# Patient Record
Sex: Female | Born: 1970 | Race: White | Hispanic: No | Smoking: Never smoker
Health system: Southern US, Community
[De-identification: ages and names within clinical notes are randomized; demographics above are authoritative.]

## PROBLEM LIST (undated history)

## (undated) DIAGNOSIS — M549 Dorsalgia, unspecified: Secondary | ICD-10-CM

## (undated) DIAGNOSIS — F988 Other specified behavioral and emotional disorders with onset usually occurring in childhood and adolescence: Secondary | ICD-10-CM

## (undated) DIAGNOSIS — O24410 Gestational diabetes mellitus in pregnancy, diet controlled: Secondary | ICD-10-CM

## (undated) DIAGNOSIS — R51 Headache: Secondary | ICD-10-CM

## (undated) DIAGNOSIS — E669 Obesity, unspecified: Secondary | ICD-10-CM

## (undated) HISTORY — PX: TUBAL LIGATION: SHX77

## (undated) HISTORY — DX: Other specified behavioral and emotional disorders with onset usually occurring in childhood and adolescence: F98.8

## (undated) HISTORY — DX: Obesity, unspecified: E66.9

## (undated) HISTORY — PX: TONSILLECTOMY: SUR1361

## (undated) HISTORY — PX: WISDOM TOOTH EXTRACTION: SHX21

## (undated) HISTORY — DX: Dorsalgia, unspecified: M54.9

## (undated) HISTORY — DX: Gestational diabetes mellitus in pregnancy, diet controlled: O24.410

## (undated) HISTORY — PX: CHOLECYSTECTOMY: SHX55

---

## 2011-11-01 ENCOUNTER — Other Ambulatory Visit (HOSPITAL_COMMUNITY)
Admission: RE | Admit: 2011-11-01 | Discharge: 2011-11-01 | Disposition: A | Discharge status: Home / Self Care | Payer: 59 | Source: Ambulatory Visit | Attending: Family Medicine | Admitting: Family Medicine

## 2011-11-01 DIAGNOSIS — Z1159 Encounter for screening for other viral diseases: Secondary | ICD-10-CM | POA: Insufficient documentation

## 2011-11-01 DIAGNOSIS — Z124 Encounter for screening for malignant neoplasm of cervix: Secondary | ICD-10-CM | POA: Insufficient documentation

## 2012-01-23 ENCOUNTER — Encounter (HOSPITAL_COMMUNITY): Payer: Self-pay | Admitting: *Deleted

## 2012-01-29 ENCOUNTER — Encounter (HOSPITAL_COMMUNITY): Payer: Self-pay | Admitting: Pharmacist

## 2012-01-31 ENCOUNTER — Other Ambulatory Visit: Payer: Self-pay | Admitting: Obstetrics and Gynecology

## 2012-02-06 MED ORDER — CLINDAMYCIN PHOSPHATE 600 MG/50ML IV SOLN
600.0000 mg | Freq: Once | INTRAVENOUS | Status: AC
  Administered 2012-02-07: 600 mg via INTRAVENOUS
  Filled 2012-02-06: qty 50

## 2012-02-06 NOTE — H&P (Signed)
Andrea Padilla, Andrea Padilla                  ACCOUNT NO.:  [id number]  MEDICAL RECORD NO.:  [id number]  LOCATION:  [facility]                         FACILITY:  [facility]  PHYSICIAN:  Lenoard Aden, M.D.DATE OF BIRTH:  1971/06/10  DATE OF ADMISSION:  01/22/2012 DATE OF DISCHARGE:                             HISTORY & PHYSICAL   CHIEF COMPLAINT:  Refractory menorrhagia.  HISTORY OF PRESENT ILLNESS:  She is a 41 year old white female, G4, P2 with dysfunctional uterine bleeding and questionable structural lesion on ultrasound.  ALLERGIES:  She has no allergies to penicillin, iodine, aspirin, codeine, cephalosporin and Levaquin.  MEDICATIONS:  Include birth control pills.  FAMILY HISTORY:  Breast cancer, hypertension, history of 2 vaginal deliveries, 2 miscarriages.  SURGICAL HISTORY:  Remarkable for cholecystectomy, tonsillectomy.  PHYSICAL EXAM:  She is a well-developed, well-nourished, white female, in no acute distress.  Height 64 inches, weight 203 pounds.  HEENT is normal.  Neck supple.  Full range of motion.  Lungs are clear.  Heart, regular rate and rhythm.  Abdomen, soft, nontender.  Pelvic exam reveals the uterus to be small, normal size and position, retroflexed.  No adnexal masses.  IMPRESSION:  Refractory menorrhagia for definitive therapy.  PLAN:  Diagnostic hysteroscopy, VersaPoint resection, NovaSure endometrial ablation.  Risks of anesthesia, infection, bleeding, injury to abdominal organs, need for repair was discussed, delayed versus immediate complications to include bowel and bladder injury noted.  The patient acknowledges and wishes to proceed.     Lenoard Aden, M.D.     RJT/MEDQ  D:  02/06/2012  T:  02/06/2012  Job:  [id number]

## 2012-02-07 ENCOUNTER — Encounter (HOSPITAL_COMMUNITY): Payer: Self-pay | Admitting: Registered Nurse

## 2012-02-07 ENCOUNTER — Encounter (HOSPITAL_COMMUNITY): Payer: Self-pay | Admitting: *Deleted

## 2012-02-07 ENCOUNTER — Ambulatory Visit (HOSPITAL_COMMUNITY)
Admission: RE | Admit: 2012-02-07 | Discharge: 2012-02-07 | Disposition: A | Discharge status: Home / Self Care | Payer: 59 | Source: Ambulatory Visit | Attending: Obstetrics and Gynecology | Admitting: Obstetrics and Gynecology

## 2012-02-07 ENCOUNTER — Other Ambulatory Visit: Payer: Self-pay | Admitting: Obstetrics and Gynecology

## 2012-02-07 ENCOUNTER — Encounter (HOSPITAL_COMMUNITY)
Admission: RE | Disposition: A | Discharge status: Home / Self Care | Payer: Self-pay | Source: Ambulatory Visit | Attending: Obstetrics and Gynecology

## 2012-02-07 ENCOUNTER — Ambulatory Visit (HOSPITAL_COMMUNITY): Payer: 59 | Admitting: Registered Nurse

## 2012-02-07 DIAGNOSIS — N840 Polyp of corpus uteri: Secondary | ICD-10-CM | POA: Insufficient documentation

## 2012-02-07 DIAGNOSIS — N920 Excessive and frequent menstruation with regular cycle: Secondary | ICD-10-CM

## 2012-02-07 HISTORY — DX: Headache: R51

## 2012-02-07 HISTORY — PX: NOVASURE ABLATION: SHX5394

## 2012-02-07 LAB — CBC
HCT: 39.4 % (ref 36.0–46.0)
RDW: 12.8 % (ref 11.5–15.5)
WBC: 8.4 10*3/uL (ref 4.0–10.5)

## 2012-02-07 SURGERY — DILATATION & CURETTAGE/HYSTEROSCOPY WITH VERSAPOINT RESECTION
Anesthesia: General | Site: Vagina | Wound class: Clean Contaminated

## 2012-02-07 MED ORDER — MEPERIDINE HCL 25 MG/ML IJ SOLN: 6.2500 mg | INTRAMUSCULAR | Status: DC | PRN

## 2012-02-07 MED ORDER — KETOROLAC TROMETHAMINE 30 MG/ML IJ SOLN
INTRAMUSCULAR | Status: AC
  Filled 2012-02-07: qty 1

## 2012-02-07 MED ORDER — BUPIVACAINE HCL (PF) 0.25 % IJ SOLN
INTRAMUSCULAR | Status: DC | PRN
  Administered 2012-02-07: 20 mL

## 2012-02-07 MED ORDER — OXYCODONE-ACETAMINOPHEN 5-325 MG PO TABS: 1.0000 | ORAL_TABLET | ORAL | Status: AC | PRN

## 2012-02-07 MED ORDER — PROMETHAZINE HCL 25 MG RE SUPP
25.0000 mg | RECTAL | Status: DC | PRN
  Administered 2012-02-07: 25 mg via RECTAL

## 2012-02-07 MED ORDER — FENTANYL CITRATE 0.05 MG/ML IJ SOLN
INTRAMUSCULAR | Status: DC | PRN
  Administered 2012-02-07: 100 ug via INTRAVENOUS

## 2012-02-07 MED ORDER — FENTANYL CITRATE 0.05 MG/ML IJ SOLN
INTRAMUSCULAR | Status: AC
  Filled 2012-02-07: qty 2

## 2012-02-07 MED ORDER — LIDOCAINE HCL (CARDIAC) 20 MG/ML IV SOLN
INTRAVENOUS | Status: AC
  Filled 2012-02-07: qty 5

## 2012-02-07 MED ORDER — FENTANYL CITRATE 0.05 MG/ML IJ SOLN
25.0000 ug | INTRAMUSCULAR | Status: DC | PRN
  Administered 2012-02-07: 50 ug via INTRAVENOUS
  Administered 2012-02-07: 25 ug via INTRAVENOUS
  Administered 2012-02-07: 50 ug via INTRAVENOUS

## 2012-02-07 MED ORDER — METOCLOPRAMIDE HCL 5 MG/ML IJ SOLN: 10.0000 mg | Freq: Once | INTRAMUSCULAR | Status: DC | PRN

## 2012-02-07 MED ORDER — FENTANYL CITRATE 0.05 MG/ML IJ SOLN
INTRAMUSCULAR | Status: AC
Start: 2012-02-07 — End: 2012-02-07
  Filled 2012-02-07: qty 2

## 2012-02-07 MED ORDER — DEXAMETHASONE SODIUM PHOSPHATE 10 MG/ML IJ SOLN
INTRAMUSCULAR | Status: AC
  Filled 2012-02-07: qty 1

## 2012-02-07 MED ORDER — PROMETHAZINE HCL 25 MG RE SUPP
RECTAL | Status: AC
  Filled 2012-02-07: qty 1

## 2012-02-07 MED ORDER — PROPOFOL 10 MG/ML IV EMUL
INTRAVENOUS | Status: AC
  Filled 2012-02-07: qty 20

## 2012-02-07 MED ORDER — SODIUM CHLORIDE 0.9 % IR SOLN
Status: DC | PRN
  Administered 2012-02-07: 3000 mL

## 2012-02-07 MED ORDER — ONDANSETRON HCL 4 MG/2ML IJ SOLN
INTRAMUSCULAR | Status: DC | PRN
  Administered 2012-02-07: 4 mg via INTRAVENOUS

## 2012-02-07 MED ORDER — VASOPRESSIN 20 UNIT/ML IJ SOLN
INTRAMUSCULAR | Status: AC
  Filled 2012-02-07: qty 1

## 2012-02-07 MED ORDER — MIDAZOLAM HCL 5 MG/5ML IJ SOLN
INTRAMUSCULAR | Status: DC | PRN
  Administered 2012-02-07: 2 mg via INTRAVENOUS

## 2012-02-07 MED ORDER — PROPOFOL 10 MG/ML IV EMUL
INTRAVENOUS | Status: DC | PRN
  Administered 2012-02-07: 200 mg via INTRAVENOUS

## 2012-02-07 MED ORDER — LIDOCAINE HCL (CARDIAC) 20 MG/ML IV SOLN
INTRAVENOUS | Status: DC | PRN
  Administered 2012-02-07: 80 mg via INTRAVENOUS

## 2012-02-07 MED ORDER — DEXAMETHASONE SODIUM PHOSPHATE 10 MG/ML IJ SOLN
INTRAMUSCULAR | Status: DC | PRN
  Administered 2012-02-07: 10 mg via INTRAVENOUS

## 2012-02-07 MED ORDER — LACTATED RINGERS IV SOLN
INTRAVENOUS | Status: DC
  Administered 2012-02-07 (×2): via INTRAVENOUS

## 2012-02-07 MED ORDER — KETOROLAC TROMETHAMINE 30 MG/ML IJ SOLN
INTRAMUSCULAR | Status: DC | PRN
  Administered 2012-02-07: 30 mg via INTRAVENOUS

## 2012-02-07 MED ORDER — ONDANSETRON HCL 4 MG/2ML IJ SOLN
INTRAMUSCULAR | Status: AC
  Filled 2012-02-07: qty 2

## 2012-02-07 MED ORDER — MIDAZOLAM HCL 2 MG/2ML IJ SOLN
INTRAMUSCULAR | Status: AC
  Filled 2012-02-07: qty 2

## 2012-02-07 SURGICAL SUPPLY — 13 items
ABLATOR ENDOMETRIAL BIPOLAR (ABLATOR) ×1 IMPLANT
CANISTER SUCTION 2500CC (MISCELLANEOUS) ×2 IMPLANT
CATH ROBINSON RED A/P 16FR (CATHETERS) ×2 IMPLANT
CLOTH BEACON ORANGE TIMEOUT ST (SAFETY) ×2 IMPLANT
CONTAINER PREFILL 10% NBF 60ML (FORM) ×4 IMPLANT
ELECTRODE RT ANGLE VERSAPOINT (CUTTING LOOP) ×2 IMPLANT
GLOVE BIO SURGEON STRL SZ7.5 (GLOVE) ×4 IMPLANT
GOWN PREVENTION PLUS LG XLONG (DISPOSABLE) ×2 IMPLANT
GOWN STRL REIN XL XLG (GOWN DISPOSABLE) ×2 IMPLANT
PACK HYSTEROSCOPY LF (CUSTOM PROCEDURE TRAY) ×2 IMPLANT
SYR TB 1ML 25GX5/8 (SYRINGE) ×2 IMPLANT
TOWEL OR 17X24 6PK STRL BLUE (TOWEL DISPOSABLE) ×4 IMPLANT
WATER STERILE IRR 1000ML POUR (IV SOLUTION) ×2 IMPLANT

## 2012-02-07 NOTE — Transfer of Care (Signed)
Immediate Anesthesia Transfer of Care Note  Patient: Andrea Padilla  Procedure(s) Performed: Procedure(s) (LRB): DILATATION & CURETTAGE/HYSTEROSCOPY WITH VERSAPOINT RESECTION (N/A) NOVASURE ABLATION (N/A)  Patient Location: PACU  Anesthesia Type: General  Level of Consciousness: awake  Airway & Oxygen Therapy: Patient Spontanous Breathing  Post-op Assessment: Post -op Vital signs reviewed and stable and Patient moving all extremities  Post vital signs: Reviewed and stable  Complications: No apparent anesthesia complications

## 2012-02-07 NOTE — Anesthesia Postprocedure Evaluation (Signed)
  Anesthesia Post-op Note  Patient: Andrea Padilla  Procedure(s) Performed: Procedure(s) (LRB): DILATATION & CURETTAGE/HYSTEROSCOPY WITH VERSAPOINT RESECTION (N/A) NOVASURE ABLATION (N/A)  Patient Location: PACU  Anesthesia Type: General  Level of Consciousness: awake, alert  and oriented  Airway and Oxygen Therapy: Patient Spontanous Breathing  Post-op Pain: none  Post-op Assessment: Post-op Vital signs reviewed, Patient's Cardiovascular Status Stable, Respiratory Function Stable, Patent Airway, No signs of Nausea or vomiting and Pain level controlled  Post-op Vital Signs: Reviewed and stable  Complications: No apparent anesthesia complications

## 2012-02-07 NOTE — Discharge Instructions (Addendum)
Hysteroscopy Hysteroscopy is a procedure used for looking inside the womb (uterus). It may be done for many different reasons, including:  To evaluate abnormal bleeding, fibroid (benign, noncancerous) tumors, polyps, scar tissue (adhesions), and possibly cancer of the uterus.   To look for lumps (tumors) and other uterine growths.   To look for causes of why a woman cannot get pregnant (infertility), causes of recurrent loss of pregnancy (miscarriages), or a lost intrauterine device (IUD).   To perform a sterilization by blocking the fallopian tubes from inside the uterus.  A hysteroscopy should be done right after a menstrual period to be sure you are not pregnant. LET YOUR CAREGIVER KNOW ABOUT:   Allergies.   Medicines taken, including herbs, eyedrops, over-the-counter medicines, and creams.   Use of steroids (by mouth or creams).   Previous problems with anesthetics or numbing medicines.   History of bleeding or blood problems.   History of blood clots.   Possibility of pregnancy, if this applies.   Previous surgery.   Other health problems.  RISKS AND COMPLICATIONS   Putting a hole in the uterus.   Excessive bleeding.   Infection.   Damage to the cervix.   Injury to other organs.   Allergic reaction to medicines.   Too much fluid used in the uterus for the procedure.  BEFORE THE PROCEDURE   Do not take aspirin or blood thinners for a week before the procedure, or as directed. It can cause bleeding.   Arrive at least 60 minutes before the procedure or as directed to read and sign the necessary forms.   Arrange for someone to take you home after the procedure.   If you smoke, do not smoke for 2 weeks before the procedure.  PROCEDURE   Your caregiver may give you medicine to relax you. He or she may also give you a medicine that numbs the area around the cervix (local anesthetic) or a medicine that makes you sleep (general anesthesia).   Sometimes, a  medicine is placed in the cervix the day before the procedure. This medicine makes the cervix have a larger opening (dilate). This makes it easier for the instrument to be inserted into the uterus.   A small instrument (hysteroscope) is inserted through the vagina into the uterus. This instrument is similar to a pencil-sized telescope with a light.   During the procedure, air or a liquid is put into the uterus, which allows the surgeon to see better.   Sometimes, tissue is gently scraped from inside the uterus. These tissue samples are sent to a specialist who looks at tissue samples (pathologist). The pathologist will give a report to your caregiver. This will help your caregiver decide if further treatment is necessary. The report will also help your caregiver decide on the best treatment if the test comes back abnormal.  AFTER THE PROCEDURE   If you had a general anesthetic, you may be groggy for a couple hours after the procedure.   If you had a local anesthetic, you will be advised to rest at the surgical center or caregiver's office until you are stable and feel ready to go home.   You may have some cramping for a couple days.   You may have bleeding, which varies from light spotting for a few days to menstrual-like bleeding for up to 3 to 7 days. This is normal.   Have someone take you home.  FINDING OUT THE RESULTS OF YOUR TEST Not all test results   are available during your visit. If your test results are not back during the visit, make an appointment with your caregiver to find out the results. Do not assume everything is normal if you have not heard from your caregiver or the medical facility. It is important for you to follow up on all of your test results. HOME CARE INSTRUCTIONS   Do not drive for 24 hours or as instructed.   Only take over-the-counter or prescription medicines for pain, discomfort, or fever as directed by your caregiver.   Do not take aspirin. It can cause or  aggravate bleeding.   Do not drive or drink alcohol while taking pain medicine.   You may resume your usual diet.   Do not use tampons, douche, or have sexual intercourse for 2 weeks, or as advised by your caregiver.   Rest and sleep for the first 24 to 48 hours.   Take your temperature twice a day for 4 to 5 days. Write it down. Give these temperatures to your caregiver if they are abnormal (above 98.6 F or 37.0 C).   Take medicines your caregiver has ordered as directed.   Follow your caregiver's advice regarding diet, exercise, lifting, driving, and general activities.   Take showers instead of baths for 2 weeks, or as recommended by your caregiver.   If you develop constipation:   Take a mild laxative with the advice of your caregiver.   Eat bran foods.   Drink enough water and fluids to keep your urine clear or pale yellow.   Try to have someone with you or available to you for the first 24 to 48 hours, especially if you had a general anesthetic.   Make sure you and your family understand everything about your operation and recovery.   Follow your caregiver's advice regarding follow-up appointments and Pap smears.  SEEK MEDICAL CARE IF:   You feel dizzy or lightheaded.   You feel sick to your stomach (nauseous).   You develop abnormal vaginal discharge.   You develop a rash.   You have an abnormal reaction or allergy to your medicine.   You need stronger pain medicine.  SEEK IMMEDIATE MEDICAL CARE IF:   Bleeding is heavier than a normal menstrual period or you have blood clots.   You have an oral temperature above 102 F (38.9 C), not controlled by medicine.   You have increasing cramps or pains not relieved with medicine.   You develop belly (abdominal) pain that does not seem to be related to the same area of earlier cramping and pain.   You pass out.   You develop pain in the tops of your shoulders (shoulder strap areas).   You develop shortness of  breath.  MAKE SURE YOU:   Understand these instructions.   Will watch your condition.   Will get help right away if you are not doing well or get worse.  Document Released: 03/17/2001 Document Revised: 08/21/2011 Document Reviewed: 07/10/2009 [employer] Patient Information 2012 [employer]. 

## 2012-02-07 NOTE — Op Note (Signed)
02/07/2012  2:37 PM  PATIENT:  Andrea Padilla  41 y.o. female  PRE-OPERATIVE DIAGNOSIS:  Menorrhagia Endometrial mass  POST-OPERATIVE DIAGNOSIS: Large endometrial polyp  PROCEDURE:  Procedure(s): DILATATION & CURETTAGE/HYSTEROSCOPY WITH VERSAPOINT RESECTION  SURGEON:  Surgeon(s): Lenoard Aden, MD  ASSISTANTS: none   ANESTHESIA:   local and general  ESTIMATED BLOOD LOSS: * No blood loss amount entered *   DRAINS: none   LOCAL MEDICATIONS USED:  MARCAINE   20cc  SPECIMEN:  Source of Specimen:  EMC and large polyp fragments  DISPOSITION OF SPECIMEN:  PATHOLOGY  COUNTS:  YES  DICTATION #: [id number]  PLAN OF CARE: DC home  PATIENT DISPOSITION:  PACU - hemodynamically stable.

## 2012-02-07 NOTE — Progress Notes (Signed)
Patient ID: Andrea Padilla, female   DOB: 10-24-1971, 41 y.o.   MRN: [id number] Patient seen and examined. Consent witnessed and signed. No changes noted. Update completed.

## 2012-02-07 NOTE — Anesthesia Preprocedure Evaluation (Signed)
Anesthesia Evaluation  Patient identified by MRN, date of birth, ID band Patient awake    Reviewed: Allergy & Precautions, H&P , NPO status , Patient's Chart, lab work & pertinent test results  Airway Mallampati: II TM Distance: >3 FB Neck ROM: Full    Dental No notable dental hx. (+) Teeth Intact   Pulmonary neg pulmonary ROS,  clear to auscultation  Pulmonary exam normal       Cardiovascular Regular Normal    Neuro/Psych  Headaches, Negative Psych ROS   GI/Hepatic negative GI ROS, Neg liver ROS,   Endo/Other  Negative Endocrine ROS  Renal/GU negative Renal ROS  Genitourinary negative   Musculoskeletal negative musculoskeletal ROS (+)   Abdominal   Peds  Hematology negative hematology ROS (+)   Anesthesia Other Findings   Reproductive/Obstetrics negative OB ROS                           Anesthesia Physical Anesthesia Plan  ASA: II  Anesthesia Plan: General   Post-op Pain Management:    Induction: Intravenous  Airway Management Planned: LMA  Additional Equipment:   Intra-op Plan:   Post-operative Plan: Extubation in OR  Informed Consent: I have reviewed the patients History and Physical, chart, labs and discussed the procedure including the risks, benefits and alternatives for the proposed anesthesia with the patient or authorized representative who has indicated his/her understanding and acceptance.   Dental advisory given  Plan Discussed with: CRNA, Anesthesiologist and Surgeon  Anesthesia Plan Comments:         Anesthesia Quick Evaluation

## 2012-02-08 NOTE — Op Note (Signed)
NAME:  Andrea Padilla, MOLNAR                  ACCOUNT NO.:  [id number]  MEDICAL RECORD NO.:  [id number]  LOCATION:  [facility]                          FACILITY:  [facility]  PHYSICIAN:  Lenoard Aden, M.D.DATE OF BIRTH:  Sep 01, 1971  DATE OF PROCEDURE: DATE OF DISCHARGE:  02/07/2012                              OPERATIVE REPORT   DESCRIPTION OF PROCEDURE:  After being apprised of risks of anesthesia, infection, bleeding, injury to abdominal organs, need for repair, delayed versus immediate complications to include bowel and bladder injury, possible need for repair, the patient brought to the operating room, was administered general anesthetic without complications, prepped and draped in usual sterile fashion.  Catheterized until bladder was empty.  Exam under anesthesia revealed a bulky retroflexed uterus and no adnexal masses.  At this time, paracervical block placed with dilute Marcaine solution.  Single-toothed tenaculum placed.  Cervix easily dilated up to a #33 Pratt dilator.  However, it was noted that the single-tooth tenaculum created a small laceration on the cervix and replaced with Christella Hartigan tenaculum.  Visualization reveals a large posterior wall fundal endometrial polyp, which was resected using the VersaPoint in multiple passes resecting the broad-based polyp in its entirety. There was a small polyp along the left lateral wall, which was also resected, D and C was then performed in a 4-quadrant method. Endometrial curettings were sent with the polypoid fragments to pathology.  At this time, the NovaSure device was seated in the appropriate fashion to a length of 6.5 and a width of 4.6.  The device was then initiated.  CO2 test was negative.  The procedure was then performed in a standard fashion.  The device was then removed after the end of the cycle, reinspected, found to be intact.  Visualization reveals an intact endometrial cavity.  No evidence of uterine perforation.  Fluid deficit  minimal.  The patient tolerated procedure well, was awakened and transferred to recovery in good condition after repair of a small cervical laceration with 2-0 Vicryl in a continuous running fashion.     Lenoard Aden, M.D.     RJT/MEDQ  D:  02/07/2012  T:  02/08/2012  Job:  [id number]

## 2012-02-10 ENCOUNTER — Encounter (HOSPITAL_COMMUNITY): Payer: Self-pay | Admitting: Obstetrics and Gynecology

## 2013-05-12 ENCOUNTER — Emergency Department (HOSPITAL_COMMUNITY)
Admission: EM | Admit: 2013-05-12 | Discharge: 2013-05-12 | Discharge status: Absent without leave | Payer: Self-pay | Source: Home / Self Care

## 2014-02-24 ENCOUNTER — Encounter (HOSPITAL_COMMUNITY): Payer: Self-pay | Admitting: Emergency Medicine

## 2014-02-24 ENCOUNTER — Emergency Department (HOSPITAL_COMMUNITY)
Admission: EM | Admit: 2014-02-24 | Discharge: 2014-02-24 | Disposition: A | Discharge status: Home / Self Care | Payer: 59 | Attending: Emergency Medicine | Admitting: Emergency Medicine

## 2014-02-24 ENCOUNTER — Emergency Department (HOSPITAL_COMMUNITY): Payer: 59

## 2014-02-24 DIAGNOSIS — Z880 Allergy status to penicillin: Secondary | ICD-10-CM | POA: Insufficient documentation

## 2014-02-24 DIAGNOSIS — R000 Tachycardia, unspecified: Secondary | ICD-10-CM | POA: Insufficient documentation

## 2014-02-24 DIAGNOSIS — Z79899 Other long term (current) drug therapy: Secondary | ICD-10-CM | POA: Insufficient documentation

## 2014-02-24 DIAGNOSIS — Z9089 Acquired absence of other organs: Secondary | ICD-10-CM | POA: Insufficient documentation

## 2014-02-24 DIAGNOSIS — J111 Influenza due to unidentified influenza virus with other respiratory manifestations: Secondary | ICD-10-CM

## 2014-02-24 DIAGNOSIS — H53149 Visual discomfort, unspecified: Secondary | ICD-10-CM | POA: Insufficient documentation

## 2014-02-24 DIAGNOSIS — M436 Torticollis: Secondary | ICD-10-CM | POA: Insufficient documentation

## 2014-02-24 DIAGNOSIS — R519 Headache, unspecified: Secondary | ICD-10-CM

## 2014-02-24 DIAGNOSIS — R110 Nausea: Secondary | ICD-10-CM | POA: Insufficient documentation

## 2014-02-24 DIAGNOSIS — R51 Headache: Secondary | ICD-10-CM

## 2014-02-24 DIAGNOSIS — Z8679 Personal history of other diseases of the circulatory system: Secondary | ICD-10-CM | POA: Insufficient documentation

## 2014-02-24 LAB — URINALYSIS, ROUTINE W REFLEX MICROSCOPIC
BILIRUBIN URINE: NEGATIVE
GLUCOSE, UA: NEGATIVE mg/dL
Hgb urine dipstick: NEGATIVE
KETONES UR: NEGATIVE mg/dL
LEUKOCYTES UA: NEGATIVE
Nitrite: NEGATIVE
PROTEIN: NEGATIVE mg/dL
Specific Gravity, Urine: 1.006 (ref 1.005–1.030)
Urobilinogen, UA: 0.2 mg/dL (ref 0.0–1.0)
pH: 6.5 (ref 5.0–8.0)

## 2014-02-24 LAB — GRAM STAIN: Special Requests: NORMAL

## 2014-02-24 LAB — COMPREHENSIVE METABOLIC PANEL
ALBUMIN: 3.9 g/dL (ref 3.5–5.2)
ALK PHOS: 93 U/L (ref 39–117)
ALT: 30 U/L (ref 0–35)
AST: 30 U/L (ref 0–37)
BILIRUBIN TOTAL: 0.6 mg/dL (ref 0.3–1.2)
BUN: 7 mg/dL (ref 6–23)
CHLORIDE: 100 meq/L (ref 96–112)
CO2: 24 mEq/L (ref 19–32)
Calcium: 9.3 mg/dL (ref 8.4–10.5)
Creatinine, Ser: 0.72 mg/dL (ref 0.50–1.10)
GFR calc Af Amer: >90 mL/min (ref >90)
GFR calc non Af Amer: >90 mL/min (ref >90)
GLUCOSE: 110 mg/dL — AB (ref 70–99)
POTASSIUM: 4.2 meq/L (ref 3.7–5.3)
SODIUM: 138 meq/L (ref 137–147)
Total Protein: 7.5 g/dL (ref 6.0–8.3)

## 2014-02-24 LAB — CSF CELL COUNT WITH DIFFERENTIAL
RBC Count, CSF: 0 /mm3
RBC Count, CSF: 7 /mm3 — ABNORMAL HIGH
Tube #: 4
Tube #: 1
WBC, CSF: 1 /mm3 (ref 0–5)
WBC, CSF: 0 /mm3 (ref 0–5)

## 2014-02-24 LAB — CBC WITH DIFFERENTIAL/PLATELET
BASOS PCT: 0 % (ref 0–1)
Basophils Absolute: 0 10*3/uL (ref 0.0–0.1)
Eosinophils Absolute: 0.3 10*3/uL (ref 0.0–0.7)
Eosinophils Relative: 2 % (ref 0–5)
HCT: 43.4 % (ref 36.0–46.0)
HEMOGLOBIN: 14.5 g/dL (ref 12.0–15.0)
LYMPHS ABS: 2 10*3/uL (ref 0.7–4.0)
Lymphocytes Relative: 19 % (ref 12–46)
MCH: 28.4 pg (ref 26.0–34.0)
MCHC: 33.4 g/dL (ref 30.0–36.0)
MCV: 85.1 fL (ref 78.0–100.0)
MONOS PCT: 13 % — AB (ref 3–12)
Monocytes Absolute: 1.4 10*3/uL — ABNORMAL HIGH (ref 0.1–1.0)
NEUTROS ABS: 7 10*3/uL (ref 1.7–7.7)
NEUTROS PCT: 66 % (ref 43–77)
Platelets: 239 10*3/uL (ref 150–400)
RBC: 5.1 MIL/uL (ref 3.87–5.11)
RDW: 13.3 % (ref 11.5–15.5)
WBC: 10.7 10*3/uL — ABNORMAL HIGH (ref 4.0–10.5)

## 2014-02-24 LAB — PROTEIN, CSF: Total  Protein, CSF: 27 mg/dL (ref 15–45)

## 2014-02-24 LAB — I-STAT CG4 LACTIC ACID, ED: LACTIC ACID, VENOUS: 1.57 mmol/L (ref 0.5–2.2)

## 2014-02-24 LAB — GLUCOSE, CSF: Glucose, CSF: 65 mg/dL (ref 43–76)

## 2014-02-24 MED ORDER — ONDANSETRON HCL 4 MG/2ML IJ SOLN
4.0000 mg | Freq: Once | INTRAMUSCULAR | Status: AC
  Administered 2014-02-24: 4 mg via INTRAVENOUS
  Filled 2014-02-24: qty 2

## 2014-02-24 MED ORDER — MORPHINE SULFATE 4 MG/ML IJ SOLN
6.0000 mg | Freq: Once | INTRAMUSCULAR | Status: AC
  Administered 2014-02-24: 6 mg via INTRAVENOUS
  Filled 2014-02-24: qty 2

## 2014-02-24 MED ORDER — SODIUM CHLORIDE 0.9 % IV BOLUS (SEPSIS)
1000.0000 mL | Freq: Once | INTRAVENOUS | Status: AC
  Administered 2014-02-24: 1000 mL via INTRAVENOUS

## 2014-02-24 MED ORDER — BENZONATATE 100 MG PO CAPS: 100.0000 mg | ORAL_CAPSULE | Freq: Three times a day (TID) | ORAL | Status: AC | PRN

## 2014-02-24 NOTE — ED Provider Notes (Signed)
CSN: [id number]     Arrival date & time 02/24/14  1131 History   First MD Initiated Contact with Patient 02/24/14 1218     Chief Complaint  Patient presents with  . Neck Pain  . Fever     (Consider location/radiation/quality/duration/timing/severity/associated sxs/prior Treatment) HPI  43 year old female sent for evaluation of possible meningitis. Patient with worsening fever/chills, body aches, headache and cough about a week ago. Reports that she tested positive for the flu a few days ago and was started on Tamiflu. Worsening headaches, neck pain/stiffness and photophobia. Seen by PCP prior to arrival and concern for meningitis so was referred to the ER. Nausea, no vomiting. No change in visual acuity. No acute numbness, tingling loss of strength. No seizure. No confusion per mother at bedside. History of migraines, but this headache is different than her typical symptoms. Denies any trauma. No use of blood thinning medication.  Past Medical History  Diagnosis Date  . Headache(784.0)     otc med prn   Past Surgical History  Procedure Laterality Date  . Tonsillectomy    . Wisdom tooth extraction    . Cesarean section      x 2  . Tubal ligation    . Cholecystectomy    . Novasure ablation  02/07/2012    Procedure: NOVASURE ABLATION;  Surgeon: Lenoard Adenichard J Taavon, MD;  Location: [facility];  Service: Gynecology;  Laterality: N/A;   History reviewed. No pertinent family history. History  Substance Use Topics  . Smoking status: Never Smoker   . Smokeless tobacco: Never Used  . Alcohol Use: Yes     Comment: occasional   OB History   Grav Para Term Preterm Abortions TAB SAB Ect Mult Living                 Review of Systems  All systems reviewed and negative, other than as noted in HPI.   Allergies  Cephalosporins; Iodine; Mushroom extract complex; Penicillins; Quinolones; Aspirin; and Codeine  Home Medications   Current Outpatient Rx  Name  Route  Sig  Dispense  Refill  .  acetaminophen (TYLENOL) 500 MG tablet   Oral   Take 1,500 mg by mouth every 6 (six) hours as needed for mild pain. As needed for migraine pain.         Marland Kitchen. amphetamine-dextroamphetamine (ADDERALL XR) 30 MG 24 hr capsule   Oral   Take 30 mg by mouth daily.         Marland Kitchen. amphetamine-dextroamphetamine (ADDERALL) 10 MG tablet   Oral   Take 10 mg by mouth daily.         Marland Kitchen. EPINEPHrine (EPIPEN JR) 0.15 MG/0.3ML injection   Intramuscular   Inject 0.15 mg into the muscle as needed for anaphylaxis.         . naproxen sodium (ANAPROX) 220 MG tablet   Oral   Take 220 mg by mouth daily as needed. As needed for migraine pain.         Marland Kitchen. oseltamivir (TAMIFLU) 75 MG capsule   Oral   Take 75 mg by mouth 2 (two) times daily. For 5 days          BP 139/89  Pulse 111  Temp(Src) 99.3 F (37.4 C) (Oral)  Resp 16  SpO2 98% Physical Exam  Nursing note and vitals reviewed. Constitutional: She is oriented to person, place, and time. She appears well-developed and well-nourished. No distress.  HENT:  Head: Normocephalic and atraumatic.  Eyes: Conjunctivae are  normal. Right eye exhibits no discharge. Left eye exhibits no discharge.  Neck: Neck supple.  No nuchal rigidity  Cardiovascular: Regular rhythm and normal heart sounds.  Exam reveals no gallop and no friction rub.   No murmur heard. tachycardic  Pulmonary/Chest: Effort normal and breath sounds normal. No respiratory distress.  Abdominal: Soft. She exhibits no distension. There is no tenderness.  Musculoskeletal: She exhibits no edema and no tenderness.  Neurological: She is alert and oriented to person, place, and time. No cranial nerve deficit. She exhibits normal muscle tone. Coordination normal.  Skin: Skin is warm and dry. She is not diaphoretic.  Psychiatric: She has a normal mood and affect. Her behavior is normal. Thought content normal.    ED Course  LUMBAR PUNCTURE Date/Time: 02/24/2014 1:45 PM Performed by: Raeford Razor Authorized by: Raeford Razor Consent: Verbal consent obtained. written consent obtained. Risks and benefits: risks, benefits and alternatives were discussed Consent given by: patient Patient identity confirmed: verbally with patient, arm band and provided demographic data Indications: evaluation for infection Local anesthetic: lidocaine 1% without epinephrine Anesthetic total: 3 ml Patient sedated: no Lumbar space: L4-L5 interspace Patient's position: sitting Needle gauge: 20 Needle type: spinal needle - Quincke tip Needle length: 3.5 in Number of attempts: 1 Fluid appearance: clear Tubes of fluid: 4 Total volume: 5 ml Post-procedure: site cleaned, pressure dressing applied and adhesive bandage applied Patient tolerance: Patient tolerated the procedure well with no immediate complications.   (including critical care time)   Labs Review Labs Reviewed  CBC WITH DIFFERENTIAL - Abnormal; Notable for the following:    WBC 10.7 (*)    Monocytes Relative 13 (*)    Monocytes Absolute 1.4 (*)    All other components within normal limits  COMPREHENSIVE METABOLIC PANEL - Abnormal; Notable for the following:    Glucose, Bld 110 (*)    All other components within normal limits  CSF CULTURE  GRAM STAIN  URINALYSIS, ROUTINE W REFLEX MICROSCOPIC  CSF CELL COUNT WITH DIFFERENTIAL  GLUCOSE, CSF  PROTEIN, CSF  CSF CELL COUNT WITH DIFFERENTIAL  I-STAT CG4 LACTIC ACID, ED   Imaging Review Dg Chest Port 1 View  02/24/2014   CLINICAL DATA:  Cough, fever, stiff neck  EXAM: PORTABLE CHEST - 1 VIEW  COMPARISON:  None.  FINDINGS: Normal mediastinum and cardiac silhouette. Normal pulmonary vasculature. No evidence of effusion, infiltrate, or pneumothorax. No acute bony abnormality.  IMPRESSION: No acute cardiopulmonary process.   Electronically Signed   By: Genevive Bi M.D.   On: 02/24/2014 13:17     EKG Interpretation None      MDM   Final diagnoses:  Influenza  Headache   Stiffness of neck    42yF with fever, body aches, HA, neck pain. Recently tested positive for flu. Worsening HA and neck pain with additionally photophobia. Suspect symptoms from flu, but concerning enough to perform a lumbar puncture. Normal. Continued symptomatic tx. Return precautions discussed.     Raeford Razor, MD 02/24/14 [phone]

## 2014-02-24 NOTE — Discharge Instructions (Signed)

## 2014-02-24 NOTE — ED Notes (Signed)
Pt alert and oriented x4. Respirations even and unlabored, bilateral symmetrical rise and fall of chest. Skin warm and dry. In no acute distress. Denies needs.   

## 2014-02-24 NOTE — ED Notes (Signed)
Per pt, Dr Uvaldo RisingMcNeil sent pt to ED to test for meningitis.  Pt tested positive for flu on Monday.  Pt has had cough, neck stiffness, fever, headache, sensitivity to light, generalized aches.

## 2014-02-27 LAB — CSF CULTURE W GRAM STAIN: Special Requests: NORMAL

## 2014-02-27 LAB — CSF CULTURE
Culture: NO GROWTH
Gram Stain: NONE SEEN

## 2014-03-03 ENCOUNTER — Ambulatory Visit
Admission: RE | Admit: 2014-03-03 | Discharge: 2014-03-03 | Disposition: A | Discharge status: Home / Self Care | Payer: 59 | Source: Ambulatory Visit | Attending: Family Medicine | Admitting: Family Medicine

## 2014-03-03 ENCOUNTER — Other Ambulatory Visit: Payer: Self-pay | Admitting: Family Medicine

## 2014-03-03 DIAGNOSIS — R519 Headache, unspecified: Secondary | ICD-10-CM

## 2014-03-03 DIAGNOSIS — R51 Headache: Principal | ICD-10-CM

## 2014-03-04 ENCOUNTER — Encounter: Payer: Self-pay | Admitting: Neurology

## 2014-03-04 ENCOUNTER — Ambulatory Visit (INDEPENDENT_AMBULATORY_CARE_PROVIDER_SITE_OTHER): Payer: 59 | Admitting: Neurology

## 2014-03-04 VITALS — BP 124/76 | HR 96 | Temp 97.9°F | Ht 64.0 in | Wt 175.0 lb

## 2014-03-04 DIAGNOSIS — R51 Headache: Secondary | ICD-10-CM

## 2014-03-04 DIAGNOSIS — R519 Headache, unspecified: Secondary | ICD-10-CM | POA: Insufficient documentation

## 2014-03-04 MED ORDER — KETOROLAC TROMETHAMINE 30 MG/ML IJ SOLN
30.0000 mg | Freq: Once | INTRAMUSCULAR | Status: AC
  Administered 2014-03-04: 30 mg via INTRAVENOUS

## 2014-03-04 MED ORDER — HYDROCODONE-IBUPROFEN 7.5-200 MG PO TABS: 1.0000 | ORAL_TABLET | Freq: Four times a day (QID) | ORAL | Status: AC | PRN

## 2014-03-04 NOTE — Progress Notes (Signed)
Patient here for seeing Dr. Anne HahnWillis.  Under aseptic technique Toradol 30 given IM in right gluteal Tolerated well.  Band-Aid applied.

## 2014-03-04 NOTE — Progress Notes (Signed)
Reason for visit: Headache  Andrea Padilla is a 43 y.o. female  History of present illness:  Andrea Padilla is a 43 year old right-handed white female with a history of migraine headache in the past. The patient has done well with her migraines for least 2 years following separation from her husband. The patient began having a viral illness around 02/17/2013, confirmed to be influenza A. The patient had a severe headache associated with severe neck stiffness. The headache and neck stiffness worsened significantly, and the patient went to the emergency room on 02/24/2014. A lumbar puncture was done, and the results were unremarkable. The patient did not have a viral meningitis. The patient has had a positional component to her headache before and after the lumbar puncture claiming that the headache gets significantly worse with sitting or standing, and better with lying down. The patient indicates that the headache never goes away. The patient may have nausea and vomiting with the headache if she stays up too long. The patient denies any numbness of the arms or legs with exception of some numbness of the right hand. The patient has dizziness with the headache. The patient denies any problems controlling the bowels or the bladder with exception that she may have some stress incontinence with coughing. With coughing, headache markedly worsens. The patient continues to have neck stiffness, and until today, the patient has been running low-grade fevers. The patient has been given a steroid injection and a Toradol injection which helped for about 12 hours. The patient is sent to this office for further evaluation. CT head scan evaluation of the brain was unremarkable. The patient does report photophobia and phonophobia with the headache.  Past Medical History  Diagnosis Date  . Headache(784.0)     otc med prn  . Obesity   . DM, gestational, diet controlled   . ADD (attention deficit disorder)   . Back pain       Past Surgical History  Procedure Laterality Date  . Tonsillectomy    . Wisdom tooth extraction    . Cesarean section      x 2  . Tubal ligation    . Cholecystectomy    . Novasure ablation  02/07/2012    Procedure: NOVASURE ABLATION;  Surgeon: Lenoard Adenichard J Taavon, MD;  Location: [facility];  Service: Gynecology;  Laterality: N/A;    Family History  Problem Relation Age of Onset  . Hypertension Mother   . Migraines Mother   . Hyperlipidemia Father   . Hypertension Father   . Breast cancer Paternal Aunt   . Breast cancer Maternal Grandmother   . Breast cancer Paternal Grandmother     Social history:  reports that she has never smoked. She has never used smokeless tobacco. She reports that she drinks alcohol. She reports that she does not use illicit drugs.  Medications:  Current Outpatient Prescriptions on File Prior to Visit  Medication Sig Dispense Refill  . acetaminophen (TYLENOL) 500 MG tablet Take 1,500 mg by mouth every 6 (six) hours as needed for mild pain. As needed for migraine pain.      Marland Kitchen. amphetamine-dextroamphetamine (ADDERALL XR) 30 MG 24 hr capsule Take 30 mg by mouth daily.      Marland Kitchen. amphetamine-dextroamphetamine (ADDERALL) 10 MG tablet Take 10 mg by mouth daily.      . benzonatate (TESSALON PERLES) 100 MG capsule Take 1 capsule (100 mg total) by mouth 3 (three) times daily as needed for cough.  21 capsule  0  No current facility-administered medications on file prior to visit.      Allergies  Allergen Reactions  . Cephalosporins Anaphylaxis  . Iodine Hives  . Mushroom Extract Complex Anaphylaxis  . Penicillins Hives  . Quinolones Anaphylaxis    Levoquin  . Aspirin Nausea And Vomiting  . Codeine Nausea And Vomiting    Extreme N/V     ROS:  Out of a complete 14 system review of symptoms, the patient complains only of the following symptoms, and all other reviewed systems are negative.  Fevers, chills Cough Stress incontinence Headache, neck  stiffness  Blood pressure 124/76, pulse 96, temperature 97.9 F (36.6 C), temperature source Oral, height 5\' 4"  (1.626 m), weight 175 lb (79.379 kg).  Physical Exam  General: The patient is alert and cooperative at the time of the examination.  Eyes: Pupils are equal, round, and reactive to light. Discs are flat bilaterally.  Neck: The neck is supple, no carotid bruits are noted.  Respiratory: The respiratory examination is clear.  Cardiovascular: The cardiovascular examination reveals a regular rate and rhythm, no obvious murmurs or rubs are noted.  Neuromuscular: Range of movement of the cervical spine is relatively full.  Skin: Extremities are without significant edema.  Neurologic Exam  Mental status: The patient is alert and oriented x 3 at the time of the examination. The patient has apparent normal recent and remote memory, with an apparently normal attention span and concentration ability.  Cranial nerves: Facial symmetry is present. There is good sensation of the face to pinprick and soft touch bilaterally. The strength of the facial muscles and the muscles to head turning and shoulder shrug are normal bilaterally. Speech is well enunciated, no aphasia or dysarthria is noted. Extraocular movements are full. Visual fields are full. The tongue is midline, and the patient has symmetric elevation of the soft palate. No obvious hearing deficits are noted.  Motor: The motor testing reveals 5 over 5 strength of all 4 extremities. Good symmetric motor tone is noted throughout.  Sensory: Sensory testing is intact to pinprick, soft touch, vibration sensation, and position sense on all 4 extremities. No evidence of extinction is noted.  Coordination: Cerebellar testing reveals good finger-nose-finger and heel-to-shin bilaterally.  Gait and station: Gait is normal. Tandem gait is normal. Romberg is negative. No drift is seen.  Reflexes: Deep tendon reflexes are symmetric and normal  bilaterally. Toes are downgoing bilaterally.   Assessment/Plan:  1. Influenza A  2. Headache, neck stiffness  The patient is having some positional component to the headache, but she indicates that this was present prior to the lumbar puncture. The patient will be given another Toradol injection today, and she is to keep her head down throughout the weekend. The patient has continued to run some low-grade fever suggesting that the viral illness is still active. The patient will be given Vicoprofen to take for her headache. If the positional component of the headache persists into next week, the patient will contact our office, and we will set her up for a blood patch. The patient will followup through this office if needed.  Marlan Palau MD 03/04/2014 7:23 PM  [facility] [address] [place], [place] [address]  Phone [phone] Fax [phone]

## 2014-03-07 ENCOUNTER — Telehealth: Payer: Self-pay | Admitting: Neurology

## 2014-03-07 DIAGNOSIS — G971 Other reaction to spinal and lumbar puncture: Secondary | ICD-10-CM

## 2014-03-07 NOTE — Telephone Encounter (Signed)
I called patient. The patient is still running low-grade fevers. The headaches clearly are still positional, and for this reason, I will go ahead and order a blood patch. If the headaches do not improve, the patient will contact me.

## 2014-03-07 NOTE — Telephone Encounter (Signed)
Pt called and stated that she is still having headaches.  The hydrocodone works but it puts her to sleep.  She stated that she was awake a total of maybe 3.5 hours yesterday with her taking the medication as prescribed.  Please call her on either her cell [phone] or her mother's cell [phone]. She would like to know what she can do to stop this headache.  Thank you

## 2014-03-08 ENCOUNTER — Other Ambulatory Visit: Payer: Self-pay | Admitting: Neurology

## 2014-03-08 ENCOUNTER — Ambulatory Visit
Admission: RE | Admit: 2014-03-08 | Discharge: 2014-03-08 | Disposition: A | Discharge status: Home / Self Care | Payer: 59 | Source: Ambulatory Visit | Attending: Neurology | Admitting: Neurology

## 2014-03-08 VITALS — BP 109/54 | HR 79

## 2014-03-08 DIAGNOSIS — G971 Other reaction to spinal and lumbar puncture: Secondary | ICD-10-CM

## 2014-03-08 DIAGNOSIS — R51 Headache: Secondary | ICD-10-CM

## 2014-03-08 MED ORDER — IOHEXOL 180 MG/ML  SOLN
1.0000 mL | Freq: Once | INTRAMUSCULAR | Status: AC | PRN
  Administered 2014-03-08: 1 mL via EPIDURAL

## 2014-03-08 MED ORDER — DIPHENHYDRAMINE HCL 50 MG PO CAPS
50.0000 mg | ORAL_CAPSULE | Freq: Once | ORAL | Status: AC
  Administered 2014-03-08: 50 mg via ORAL

## 2014-03-08 NOTE — Discharge Instructions (Addendum)
Blood Patch Discharge Instructions ° °1. Go home and rest quietly for the next 24 hours.  It is important to lie flat for the next 24 hours.  Get up only to go to the restroom.  You may lie in the bed or on a couch on your back, your stomach, your left side or your right side.  You may have one pillow under your head.  You may have pillows between your knees while you are on your side or under your knees while you are on your back. ° °2. DO NOT drive today.  Recline the seat as far back as it will go, while still wearing your seat belt, on the way home. ° °3. You may get up to go to the bathroom as needed.  You may sit up for 10 minutes to eat.  You may resume your normal diet and medications unless otherwise indicated.  Drink lots of extra fluids today and tomorrow ° °4. You may resume normal activities after your 24 hours of bed rest is over; however, do not exert yourself strongly or do any heavy lifting tomorrow. ° °5. Call your physician for a follow-up appointment.  ° °6. If you have any questions or if complications develop after you arrive home, please call [phone]. ° °Discharge instructions have been explained to the patient.  The patient, or the person responsible for the patient, fully understands these instructions. °

## 2014-03-08 NOTE — Progress Notes (Signed)
Blood drawn from right forearm just below elbow X 2 sticks.  Site is slightly bruised. 20 cc's of blood obtained and pt tolerated procedure well. Discharge instructions explained.

## 2014-03-09 ENCOUNTER — Telehealth: Payer: Self-pay | Admitting: Neurology

## 2014-03-10 ENCOUNTER — Telehealth: Payer: Self-pay | Admitting: Neurology

## 2014-03-10 NOTE — Telephone Encounter (Signed)
Kathy from Dr. Ephriam Knucklesankin's offiOlegario Messierce would like for Lupita LeashDonna to call her back concerning pt's blood patch. The Dr. Is filling out FMLA papers for pt and would like to know if the blood patch that was done (1. What is the recovery time and 2. When will pt be able to return to work)? You can reach AthensKathy at [phone] her lunch is 1-2 you can leave a message on her vm or other times you can overhead page her. Thanks

## 2014-03-11 NOTE — Telephone Encounter (Signed)
Calling back because she feels she needs to speak with someone about some information for FMLA papers. States she needs a call back today and will only be in the office until 1:00 p.m

## 2014-03-11 NOTE — Telephone Encounter (Signed)
Olegario MessierKathy from Dr. Marlane Hatcherankins's office calling to get Dr. Anne HahnWillis recommendation on the pt returning to work. Dr. Luciana Axeankin is filling out FMLA papers for the pt. Please contact Olegario MessierKathy at [phone], Dr. Anne HahnWillis can leave a message if need be. Please advise

## 2014-03-11 NOTE — Telephone Encounter (Signed)
I called patient. The patient has had the blood patch, she believes that it did help with the headache, and the headache is no longer positional, and has improved in severity. The patient is still having viral symptoms with sore throat, and stuffy nose. The patient is no longer running fevers. I would probably keep this patient out of work through next week. If she is doing better, she could go back to work by 03/21/2014.  I called Dr. Ephriam Knucklesankin's office and I left a message.

## 2014-03-14 NOTE — Telephone Encounter (Signed)
Left message for Andrea LettersKimberly Padilla, with [employer], who is out of the office until 03-21-14.  I relayed that we did not do a disability claim for the patient, it may have been initiated by the patient's PCP, Barton FannyVictoria Rankin.  I will forward the information to them.

## 2014-03-17 ENCOUNTER — Telehealth: Payer: Self-pay | Admitting: Neurology

## 2014-03-17 MED ORDER — NORTRIPTYLINE HCL 10 MG PO CAPS: ORAL_CAPSULE | ORAL | Status: AC

## 2014-03-17 NOTE — Telephone Encounter (Signed)
Pt calling stating that she is still having headaches and wondering if she needs to come in to see Dr. Anne HahnWillis or just go back to see her PCP. Please advise

## 2014-03-17 NOTE — Telephone Encounter (Signed)
Patient calling to state that she still has a headache and is wondering if she needs to be seen by Dr. Anne HahnWillis again or just go back to her PCP. Please call and advise patient.

## 2014-03-17 NOTE — Telephone Encounter (Signed)
I called patient. The patient still having ongoing headaches. I will call in a prescription for nortriptyline to see this benefits her.

## 2014-07-17 IMAGING — CT CT HEAD W/O CM
2 series · 16 of 30 positions shown, 20 images · non-contrast
Comparison: None.

CLINICAL DATA: Headache

EXAM:
CT HEAD WITHOUT CONTRAST
TECHNIQUE: Contiguous axial images were obtained from the base of the skull
through the vertex without intravenous contrast.

[Series 3: head bone · axial · 0.44mm/px · z∈[+12,+59]mm · 3 of 32 slices shown]
[im 3/32  bone]
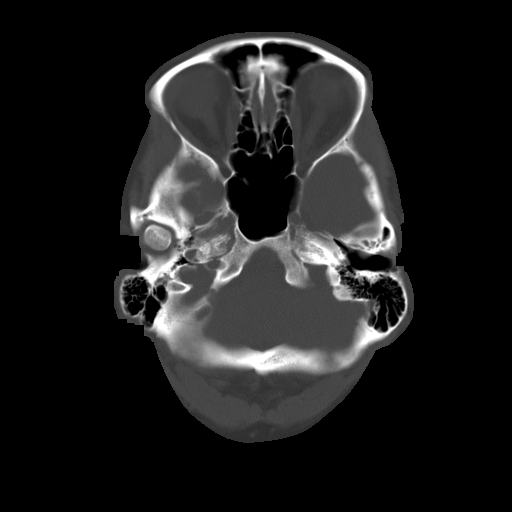
[im 7/32  bone]
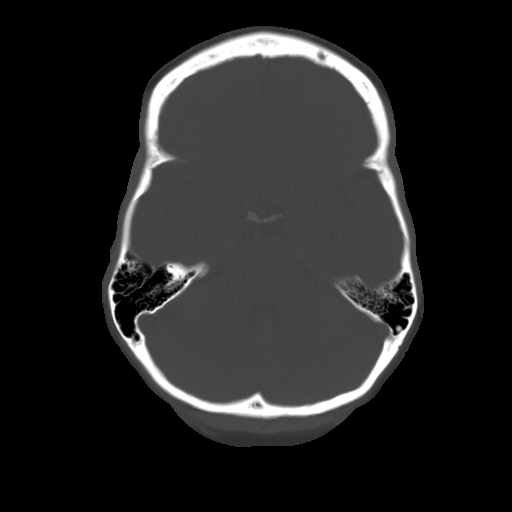
[im 12/32  bone]
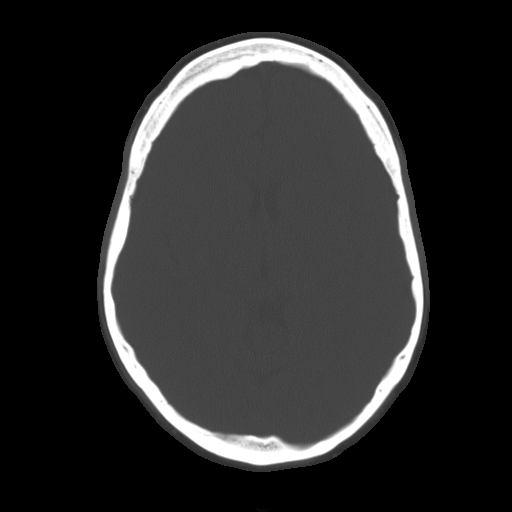

[Series 32: 3d filtered head w/o · axial · non-contrast · 0.44mm/px · z∈[+12,+149]mm · 13 of 32 slices shown, 17 images]
[im 3/32  brain]
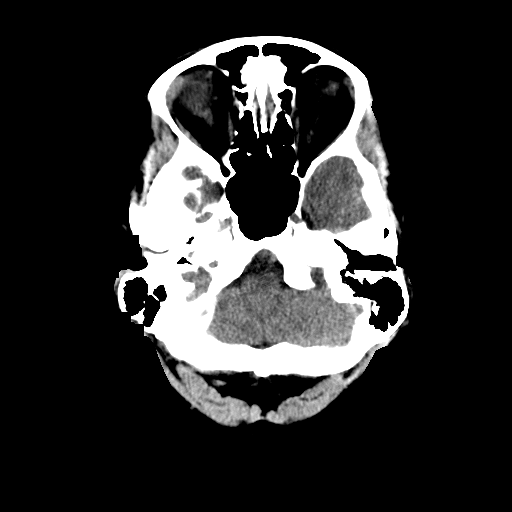
[im 3/32  bone]
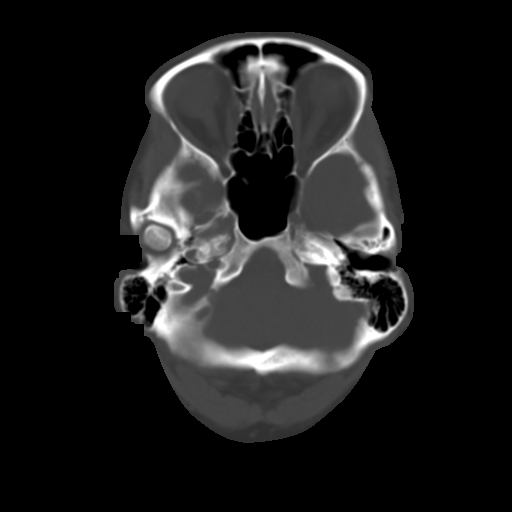
[im 5/32  brain]
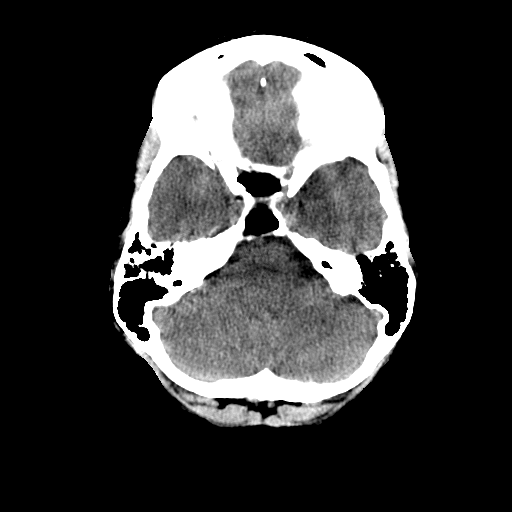
[im 7/32  brain]
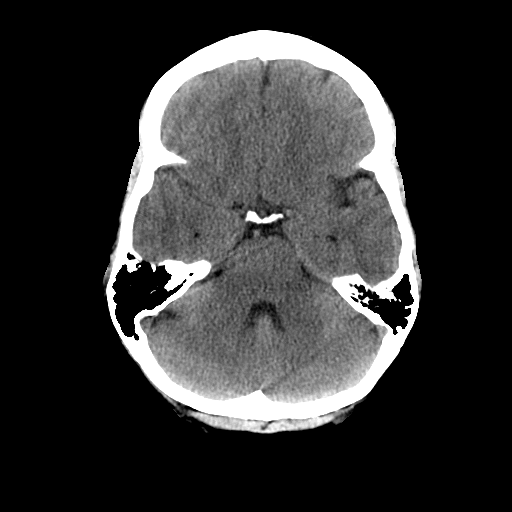
[im 9/32  brain]
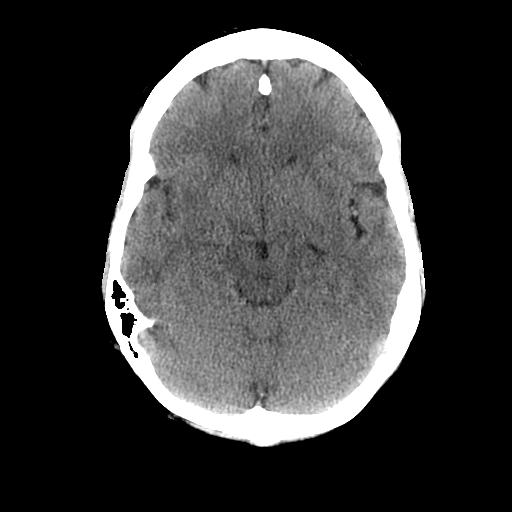
[im 12/32  brain]
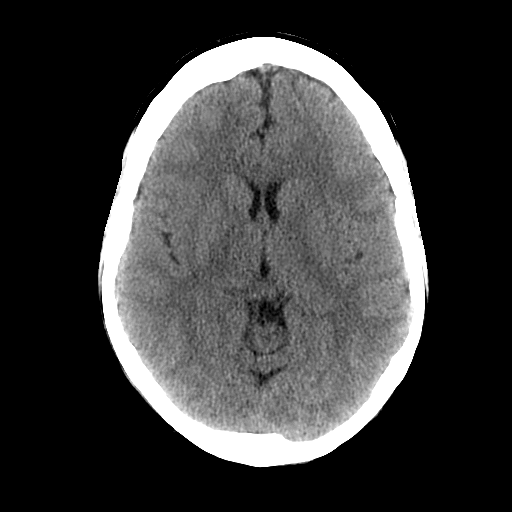
[im 12/32  bone]
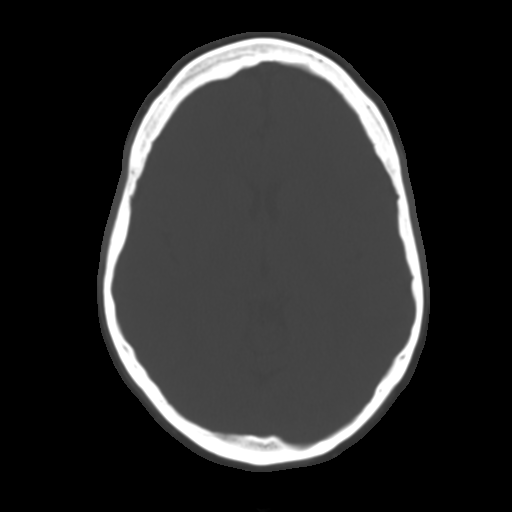
[im 14/32  brain]
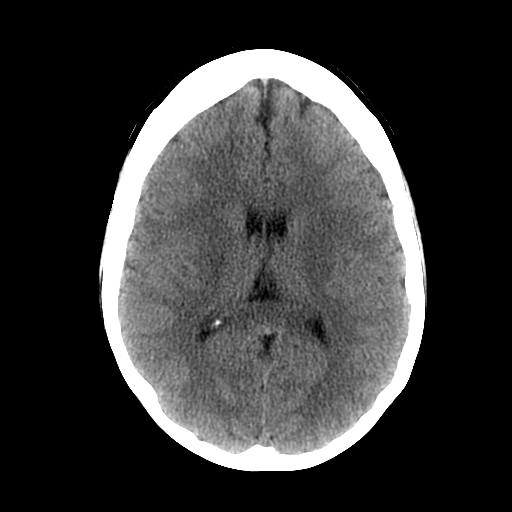
[im 16/32  brain]
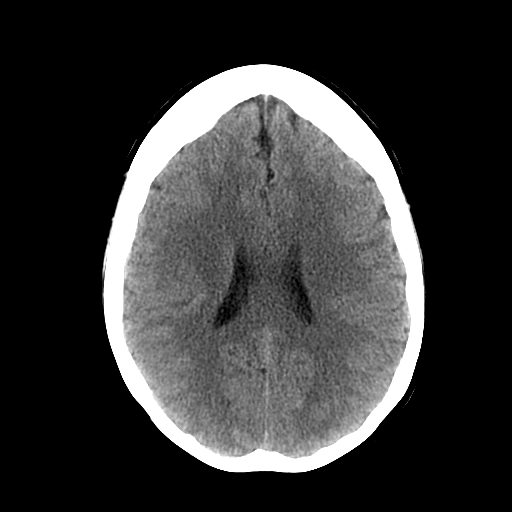
[im 18/32  brain]
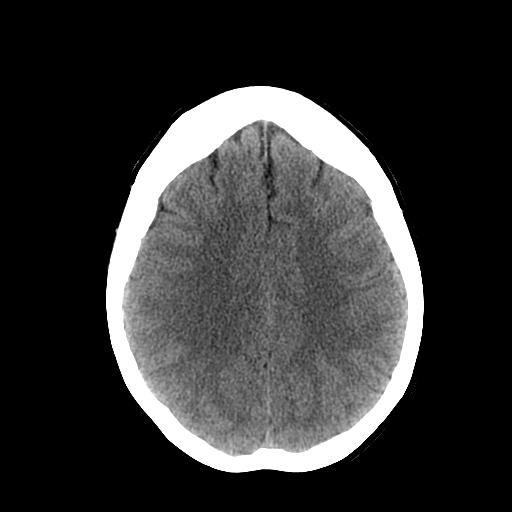
[im 20/32  brain]
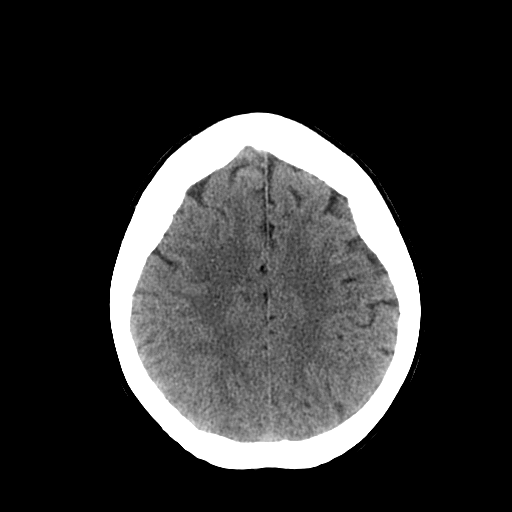
[im 20/32  bone]
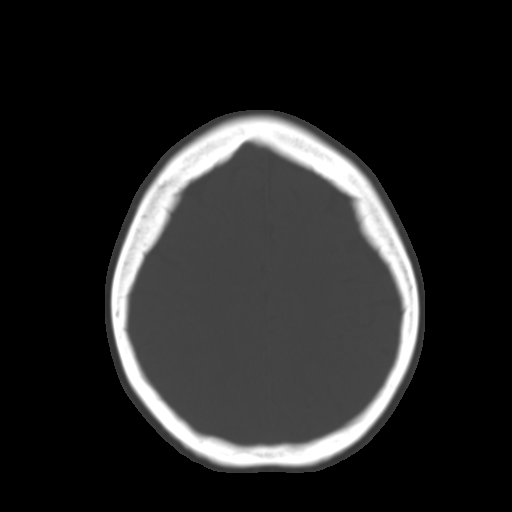
[im 23/32  brain]
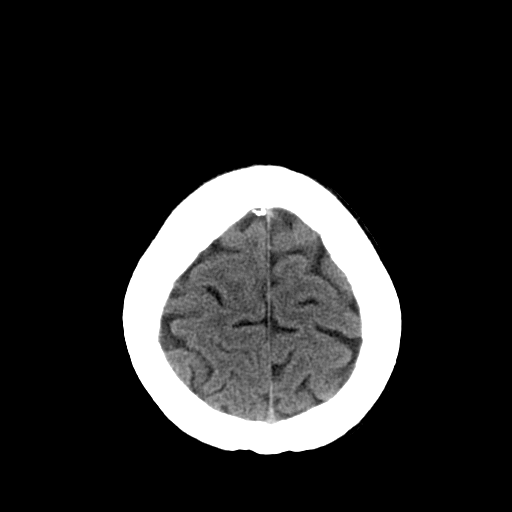
[im 25/32  brain]
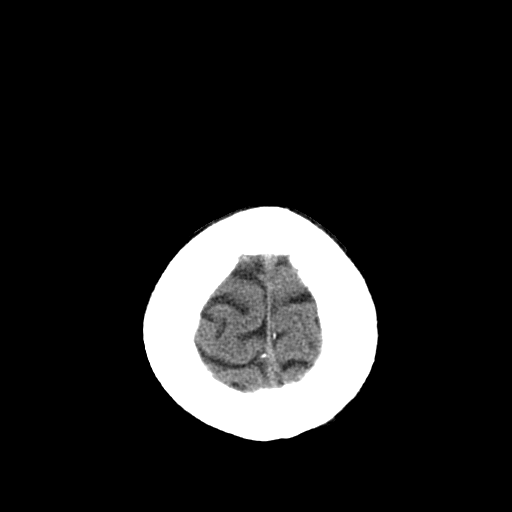
[im 27/32  brain]
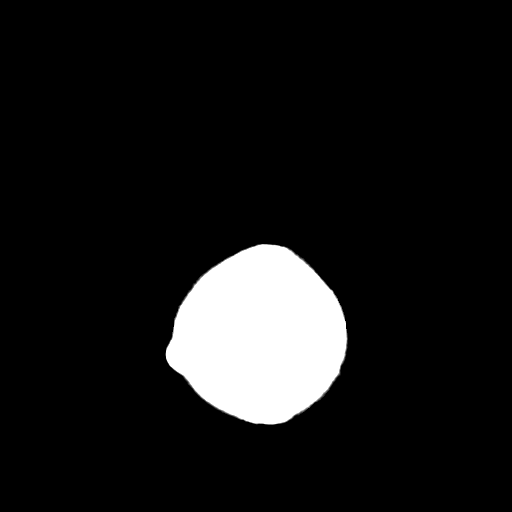
[im 29/32  brain]
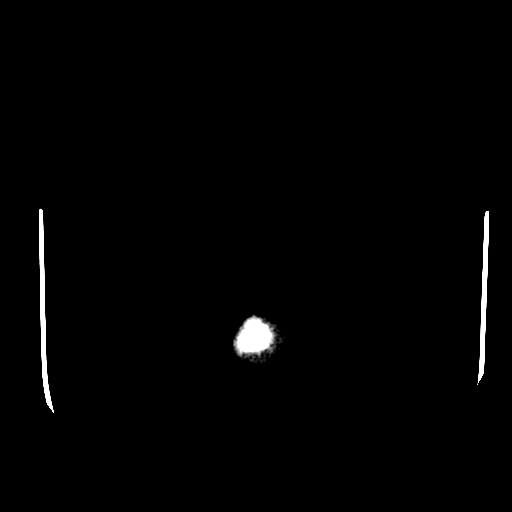
[im 29/32  bone]
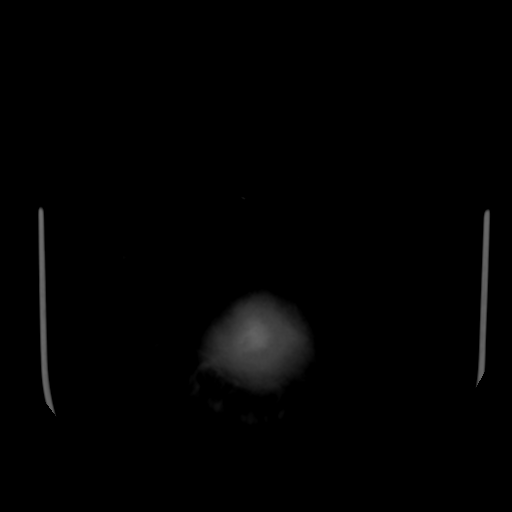

[16 of 30 positions shown; findings below may reference images not displayed]

FINDINGS: Skull and Sinuses:No significant abnormality.

Orbits: No acute abnormality.

Brain: No evidence of acute abnormality, such as acute infarction,
hemorrhage, hydrocephalus, or mass lesion/mass effect. Circumscribed
low attenuation in the region of the posterior right putamen is most
likely a dilated perivascular space.
IMPRESSION: No acute intracranial abnormality.

## 2016-03-29 ENCOUNTER — Other Ambulatory Visit (HOSPITAL_COMMUNITY)
Admission: RE | Admit: 2016-03-29 | Discharge: 2016-03-29 | Disposition: A | Discharge status: Home / Self Care | Payer: BLUE CROSS/BLUE SHIELD | Source: Ambulatory Visit | Attending: Family Medicine | Admitting: Family Medicine

## 2016-03-29 ENCOUNTER — Other Ambulatory Visit: Payer: Self-pay | Admitting: Family Medicine

## 2016-03-29 DIAGNOSIS — Z1151 Encounter for screening for human papillomavirus (HPV): Secondary | ICD-10-CM | POA: Diagnosis not present

## 2016-03-29 DIAGNOSIS — Z124 Encounter for screening for malignant neoplasm of cervix: Secondary | ICD-10-CM | POA: Insufficient documentation

## 2016-04-02 LAB — CYTOLOGY - PAP

## 2020-07-12 DIAGNOSIS — F988 Other specified behavioral and emotional disorders with onset usually occurring in childhood and adolescence: Secondary | ICD-10-CM | POA: Diagnosis not present

## 2022-07-02 ENCOUNTER — Emergency Department (HOSPITAL_BASED_OUTPATIENT_CLINIC_OR_DEPARTMENT_OTHER): Payer: BC Managed Care – PPO

## 2022-07-02 ENCOUNTER — Emergency Department (HOSPITAL_BASED_OUTPATIENT_CLINIC_OR_DEPARTMENT_OTHER)
Admission: EM | Admit: 2022-07-02 | Discharge: 2022-07-03 | Disposition: A | Discharge status: Home / Self Care | Payer: BC Managed Care – PPO | Attending: Emergency Medicine | Admitting: Emergency Medicine

## 2022-07-02 ENCOUNTER — Encounter (HOSPITAL_BASED_OUTPATIENT_CLINIC_OR_DEPARTMENT_OTHER): Payer: Self-pay | Admitting: Emergency Medicine

## 2022-07-02 ENCOUNTER — Other Ambulatory Visit: Payer: Self-pay

## 2022-07-02 DIAGNOSIS — S5012XA Contusion of left forearm, initial encounter: Secondary | ICD-10-CM | POA: Insufficient documentation

## 2022-07-02 DIAGNOSIS — W2107XA Struck by softball, initial encounter: Secondary | ICD-10-CM | POA: Insufficient documentation

## 2022-07-02 DIAGNOSIS — M25532 Pain in left wrist: Secondary | ICD-10-CM | POA: Insufficient documentation

## 2022-07-02 DIAGNOSIS — S59912A Unspecified injury of left forearm, initial encounter: Secondary | ICD-10-CM | POA: Diagnosis present

## 2022-07-02 DIAGNOSIS — Y9364 Activity, baseball: Secondary | ICD-10-CM | POA: Insufficient documentation

## 2022-07-02 MED ORDER — ACETAMINOPHEN 325 MG PO TABS
650.0000 mg | ORAL_TABLET | Freq: Once | ORAL | Status: AC
  Administered 2022-07-02: 650 mg via ORAL

## 2022-07-02 MED ORDER — ACETAMINOPHEN 325 MG PO TABS
ORAL_TABLET | ORAL | Status: AC
  Filled 2022-07-02: qty 2

## 2022-07-02 NOTE — ED Provider Notes (Signed)
[facility] EMERGENCY DEPARTMENT Provider Note   CSN: [id number] Arrival date & time: 07/02/22  1959     History {Add pertinent medical, surgical, social history, OB history to HPI:1} Chief Complaint  Patient presents with   Wrist Pain    Andrea Padilla is a 51 y.o. female who presents today for evaluation of pain and swelling in the left forearm. She states that prior to arrival she was playing fast pitch softball and was hit on her left forearm/wrist.  She estimates this was about 60 mph.  She denies any weakness, numbness, or tingling.  She has pain with movement in the elbow and movements of the wrist.  She does not take any blood thinning medications.  She notes that she is mostly right-handed however is somewhat ambidextrous.  HPI     Home Medications Prior to Admission medications   Medication Sig Start Date End Date Taking? Authorizing Provider  acetaminophen (TYLENOL) 500 MG tablet Take 1,500 mg by mouth every 6 (six) hours as needed for mild pain. As needed for migraine pain.    [provider]  amphetamine-dextroamphetamine (ADDERALL XR) 30 MG 24 hr capsule Take 30 mg by mouth daily.    [provider]  amphetamine-dextroamphetamine (ADDERALL) 10 MG tablet Take 10 mg by mouth daily.    [provider]  benzonatate (TESSALON PERLES) 100 MG capsule Take 1 capsule (100 mg total) by mouth 3 (three) times daily as needed for cough. 02/24/14   Raeford Razor, MD  HYDROcodone-ibuprofen (VICOPROFEN) 7.5-200 MG per tablet Take 1 tablet by mouth every 6 (six) hours as needed for moderate pain. 03/04/14   York Spaniel, MD  nortriptyline (PAMELOR) 10 MG capsule Take one capsule at night for one week, then take 2 capsules at night for one week, then take 3 capsules at night 03/17/14   York Spaniel, MD      Allergies    Cephalosporins, Mushroom extract complex, Quinolones, Aspirin, Penicillins, Codeine, and Contrast media [iodinated contrast  media]    Review of Systems   Review of Systems  Physical Exam Updated Vital Signs BP 124/80 (BP Location: Right Arm)   Pulse (!) 112   Temp 98.6 F (37 C) (Oral)   Resp 18   Ht 5\' 4"  (1.626 m)   Wt 83.9 kg   SpO2 98%   BMI 31.76 kg/m  Physical Exam Vitals and nursing note reviewed.  Constitutional:      General: She is not in acute distress.    Appearance: She is not ill-appearing.  HENT:     Head: Normocephalic.  Cardiovascular:     Rate and Rhythm: Normal rate.     Comments: 2+ left radial pulse.  Fingers on left hand have capillary refill under 2 seconds Pulmonary:     Effort: Pulmonary effort is normal. No respiratory distress.  Musculoskeletal:     Comments: Obvious edema over the left distal forearm with associated contusion and tenderness.  Compartments in the forearm are soft and easily compressible.  She has full active range of motion of the fingers on the left hand.  There is mild pain over the ulnar aspect of the elbow with elbow range of motion.  There is no tenderness to palpation on the remainder of the left upper arm or shoulder.  No pain with left shoulder range of motion.  Skin:    Comments: There is a 1 to 2 mm superficial wound over the dorsum of the left wrist.  Neurological:     Mental Status: She is alert.     Comments: Station intact to light touch to left hand     ED Results / Procedures / Treatments   Labs (all labs ordered are listed, but only abnormal results are displayed) Labs Reviewed - No data to display  EKG None  Radiology DG Wrist Complete Left  Result Date: 07/02/2022 CLINICAL DATA:  Hip by ball with wrist pain in a 51 year old female. EXAM: LEFT WRIST - COMPLETE 3+ VIEW COMPARISON:  None available. FINDINGS: There is no evidence of fracture or dislocation. There is no evidence of arthropathy or other focal bone abnormality. Soft tissues are unremarkable. IMPRESSION: Negative. Electronically Signed   By: Donzetta Kohut M.D.   On:  07/02/2022 20:43    Procedures Procedures  {Document cardiac monitor, telemetry assessment procedure when appropriate:1}  Medications Ordered in ED Medications  acetaminophen (TYLENOL) 325 MG tablet (  Not Given 07/02/22 2016)  acetaminophen (TYLENOL) tablet 650 mg (650 mg Oral Given 07/02/22 2015)    ED Course/ Medical Decision Making/ A&P                           Medical Decision Making Amount and/or Complexity of Data Reviewed Radiology: ordered.  Risk OTC drugs.   ***  {Document critical care time when appropriate:1} {Document review of labs and clinical decision tools ie heart score, Chads2Vasc2 etc:1}  {Document your independent review of radiology images, and any outside records:1} {Document your discussion with family members, caretakers, and with consultants:1} {Document social determinants of health affecting pt's care:1} {Document your decision making why or why not admission, treatments were needed:1} Final Clinical Impression(s) / ED Diagnoses Final diagnoses:  None    Rx / DC Orders ED Discharge Orders     None

## 2022-07-02 NOTE — ED Triage Notes (Signed)
  Patient comes in with L wrist contusion from fast pitch softball game.  Patient states she was batting and pitch hit her L wrist going approximately 60 mph.  Patient able to move fingers and has good cap refill less that 3 seconds.  Area is swollen and painful to touch.  Pain 6/10, achy.

## 2022-07-03 NOTE — Discharge Instructions (Addendum)
Your x-rays did not show any broken bones. You have been given a Ace wrap and a splint for comfort.  You can take these off to ice, for hygiene and as you desire.  If you develop severe uncontrolled pain, have numbness or weakness please seek additional medical care and evaluation.    Please take Ibuprofen (Advil, motrin) and Tylenol (acetaminophen) to relieve your pain.    You may take up to 600 MG (3 pills) of normal strength ibuprofen every 8 hours as needed.   You make take tylenol, up to 1,000 mg (two extra strength pills) every 8 hours as needed.   It is safe to take ibuprofen and tylenol at the same time as they work differently.   Do not take more than 3,000 mg tylenol in a 24 hour period (not more than one dose every 8 hours.  Please check all medication labels as many medications such as pain and cold medications may contain tylenol.  Do not drink alcohol while taking these medications.  Do not take other NSAID'S while taking ibuprofen (such as aleve or naproxen).  Please take ibuprofen with food to decrease stomach upset.
